# Patient Record
Sex: Male | Born: 1980
Health system: Southern US, Community
[De-identification: ages and names within clinical notes are randomized; demographics above are authoritative.]

---

## 2009-02-10 ENCOUNTER — Encounter: Admission: RE | Admit: 2009-02-10 | Discharge: 2009-02-10 | Payer: Self-pay | Admitting: Family Medicine

## 2010-10-22 IMAGING — CR DG CHEST 2V
2 series · 2 of 2 positions shown · non-contrast
Comparison: None

CLINICAL DATA: Family history lung cancer.  Routine physical exam.

CHEST - 2 VIEW

[view not recorded (1 of 2)]
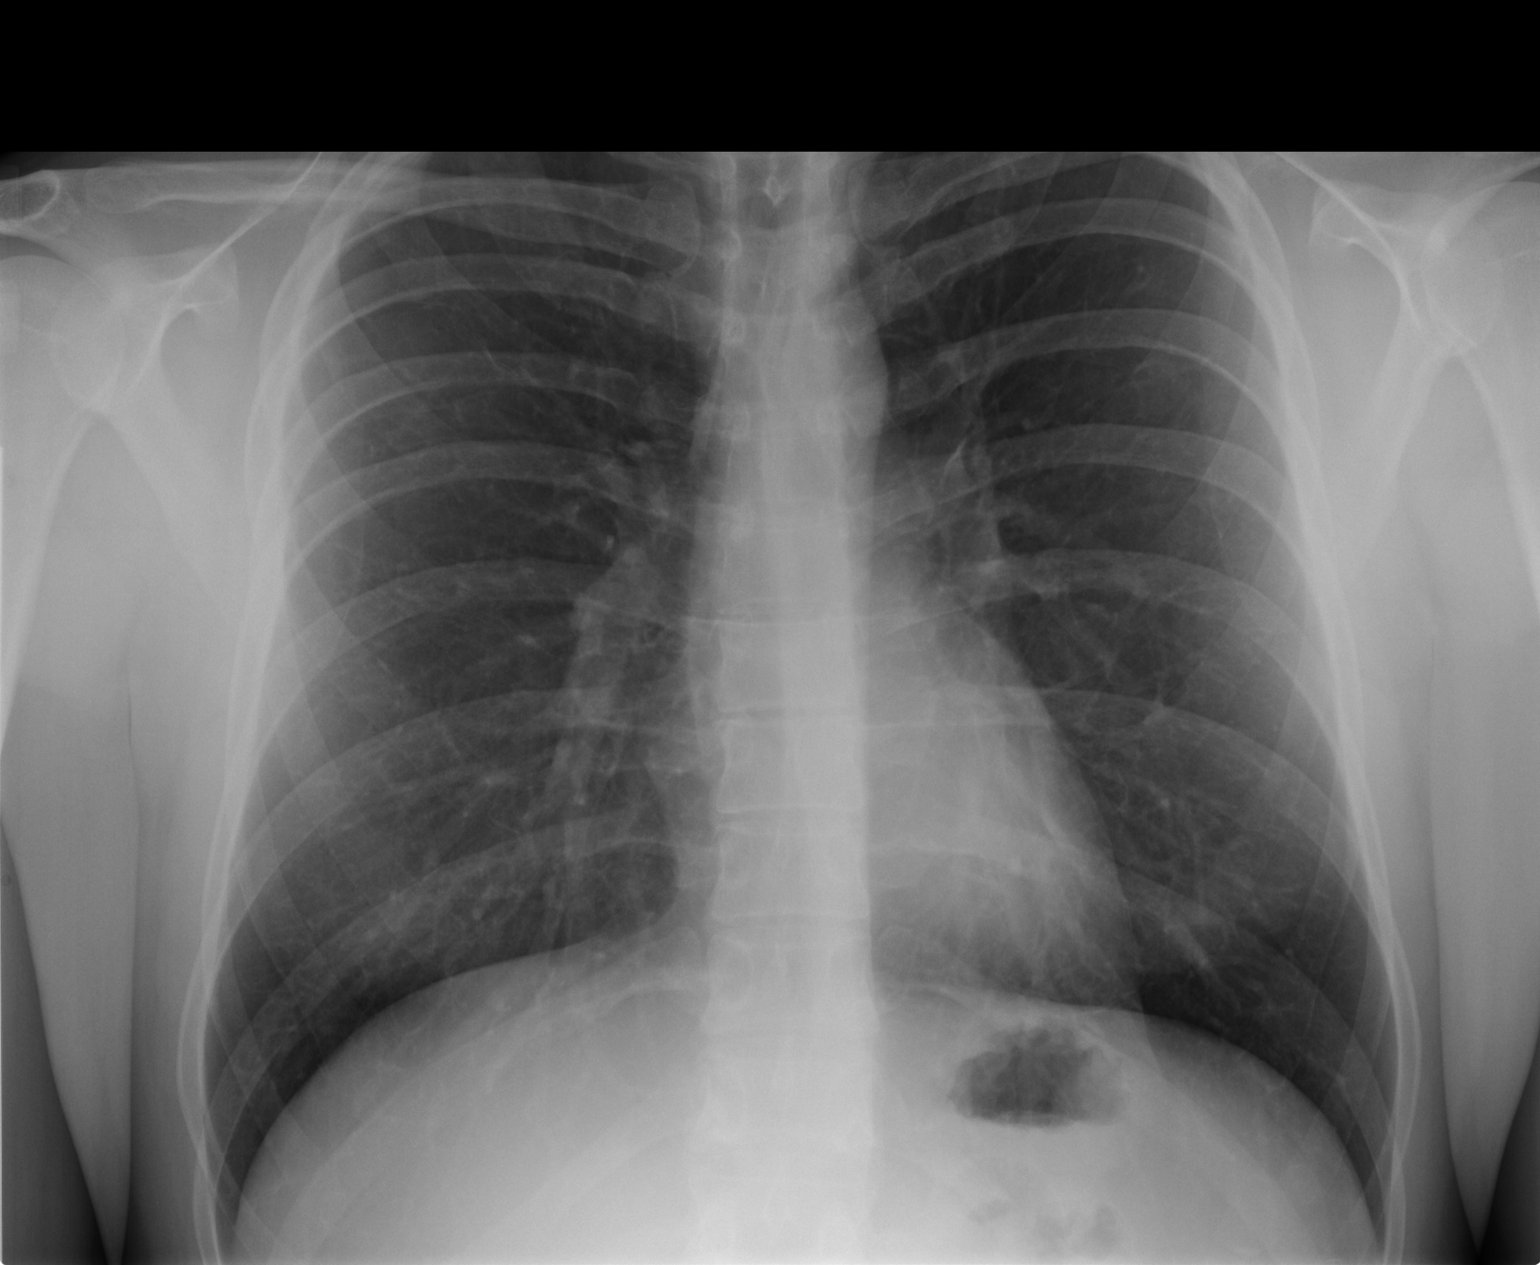

[view not recorded (2 of 2)]
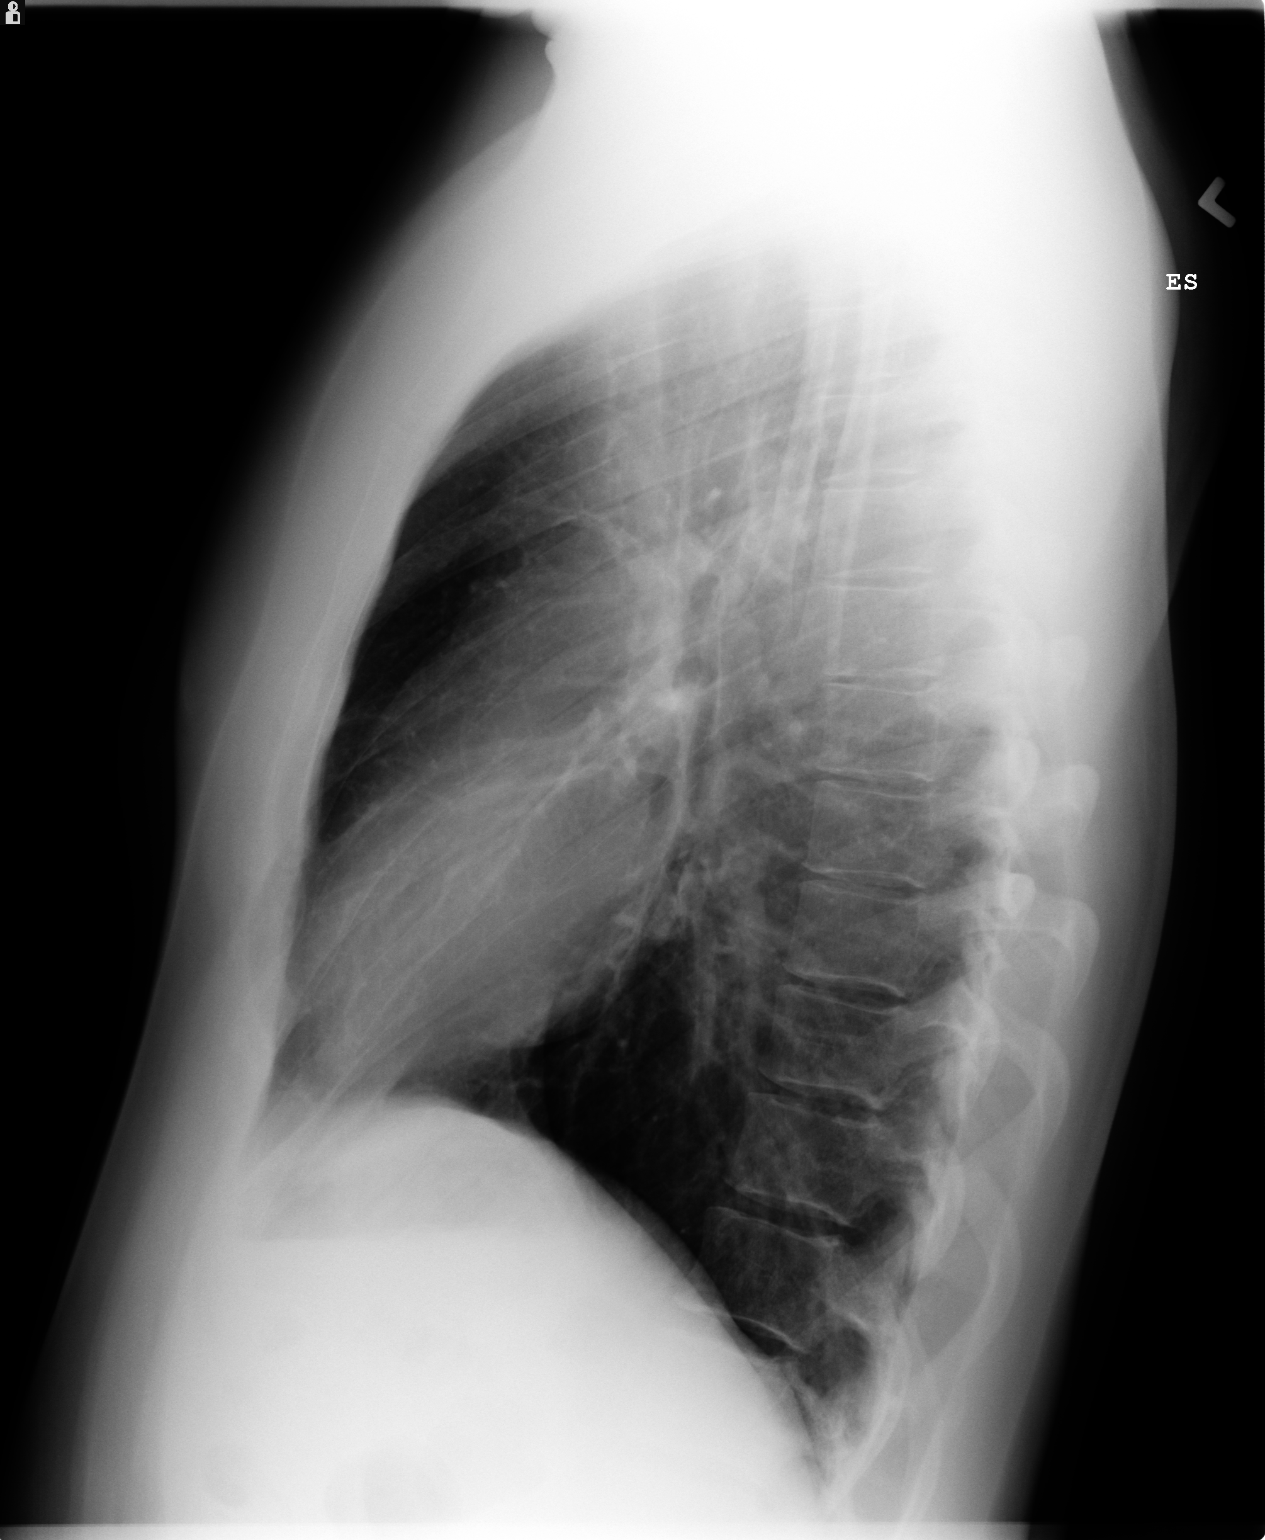

[2 of 2 positions shown; findings below may reference images not displayed]

FINDINGS: Lungs are clear.  Heart size and configuration are
normal.  Mediastinum, hila, pleura and osseous structures appear
normal.
IMPRESSION: Normal.

## 2016-07-03 DIAGNOSIS — R030 Elevated blood-pressure reading, without diagnosis of hypertension: Secondary | ICD-10-CM | POA: Diagnosis not present

## 2016-07-03 DIAGNOSIS — J0100 Acute maxillary sinusitis, unspecified: Secondary | ICD-10-CM | POA: Diagnosis not present

## 2016-07-20 DIAGNOSIS — R030 Elevated blood-pressure reading, without diagnosis of hypertension: Secondary | ICD-10-CM | POA: Diagnosis not present

## 2017-01-31 DIAGNOSIS — Z23 Encounter for immunization: Secondary | ICD-10-CM | POA: Diagnosis not present

## 2017-03-31 DIAGNOSIS — J069 Acute upper respiratory infection, unspecified: Secondary | ICD-10-CM | POA: Diagnosis not present

## 2017-03-31 DIAGNOSIS — B9789 Other viral agents as the cause of diseases classified elsewhere: Secondary | ICD-10-CM | POA: Diagnosis not present

## 2017-04-24 DIAGNOSIS — Z0289 Encounter for other administrative examinations: Secondary | ICD-10-CM | POA: Diagnosis not present

## 2017-04-24 DIAGNOSIS — J309 Allergic rhinitis, unspecified: Secondary | ICD-10-CM | POA: Diagnosis not present

## 2017-04-24 DIAGNOSIS — Z0283 Encounter for blood-alcohol and blood-drug test: Secondary | ICD-10-CM | POA: Diagnosis not present

## 2017-06-11 DIAGNOSIS — R0789 Other chest pain: Secondary | ICD-10-CM | POA: Diagnosis not present

## 2017-07-08 DIAGNOSIS — J0100 Acute maxillary sinusitis, unspecified: Secondary | ICD-10-CM | POA: Diagnosis not present

## 2018-01-23 DIAGNOSIS — S0033XA Contusion of nose, initial encounter: Secondary | ICD-10-CM | POA: Diagnosis not present

## 2018-01-23 DIAGNOSIS — S060X0A Concussion without loss of consciousness, initial encounter: Secondary | ICD-10-CM | POA: Diagnosis not present

## 2018-03-06 DIAGNOSIS — Z23 Encounter for immunization: Secondary | ICD-10-CM | POA: Diagnosis not present

## 2019-07-16 ENCOUNTER — Ambulatory Visit (INDEPENDENT_AMBULATORY_CARE_PROVIDER_SITE_OTHER): Payer: Commercial Managed Care - PPO | Admitting: Otolaryngology

## 2019-07-16 ENCOUNTER — Encounter (INDEPENDENT_AMBULATORY_CARE_PROVIDER_SITE_OTHER): Payer: Self-pay | Admitting: Otolaryngology

## 2019-07-16 ENCOUNTER — Other Ambulatory Visit: Payer: Self-pay

## 2019-07-16 VITALS — Temp 97.9°F

## 2019-07-16 DIAGNOSIS — M542 Cervicalgia: Secondary | ICD-10-CM

## 2019-07-16 DIAGNOSIS — G9001 Carotid sinus syncope: Secondary | ICD-10-CM | POA: Diagnosis not present

## 2019-07-16 NOTE — Progress Notes (Addendum)
HPI: Daniel Lozano is a 39 y.o. male who presents for evaluation of right neck discomfort he developed about 2 days ago.  He had this occur several months ago twice before and it spontaneously got better with no specific treatment.  He describes pain or discomfort in the right neck and is worse when he turns his neck to the left.  He also feels discomfort when he swallows with his head turned to the left.  He does not have any difficulty swallowing.  He has had no sore throat. He is not sure what brought this on. He does not smoke  No past medical history on file.  Social History   Socioeconomic History  . Marital status: Unknown    Spouse name: Not on file  . Number of children: Not on file  . Years of education: Not on file  . Highest education level: Not on file  Occupational History  . Not on file  Tobacco Use  . Smoking status: Never Smoker  . Smokeless tobacco: Never Used  Substance and Sexual Activity  . Alcohol use: Not on file  . Drug use: Not on file  . Sexual activity: Not on file  Other Topics Concern  . Not on file  Social History Narrative  . Not on file   Social Determinants of Health   Financial Resource Strain:   . Difficulty of Paying Living Expenses: Not on file  Food Insecurity:   . Worried About Programme researcher, broadcasting/film/video in the Last Year: Not on file  . Ran Out of Food in the Last Year: Not on file  Transportation Needs:   . Lack of Transportation (Medical): Not on file  . Lack of Transportation (Non-Medical): Not on file  Physical Activity:   . Days of Exercise per Week: Not on file  . Minutes of Exercise per Session: Not on file  Stress:   . Feeling of Stress : Not on file  Social Connections:   . Frequency of Communication with Friends and Family: Not on file  . Frequency of Social Gatherings with Friends and Family: Not on file  . Attends Religious Services: Not on file  . Active Member of Clubs or Organizations: Not on file  . Attends Tax inspector Meetings: Not on file  . Marital Status: Not on file   No family history on file. Not on File Prior to Admission medications   Not on File     Positive ROS: Otherwise negative  All other systems have been reviewed and were otherwise negative with the exception of those mentioned in the HPI and as above.  Physical Exam: Constitutional: Alert, well-appearing, no acute distress Ears: External ears without lesions or tenderness. Ear canals he has some wax in the right ear canal that was cleaned with a suction.  Left ear canal was clear.  Clear TMs bilaterally. Nasal: External nose without lesions. Septum deviated to the left with left septal spur posteriorly.. Clear nasal passages bilaterally. Oral: Lips and gums without lesions. Tongue and palate mucosa without lesions. Posterior oropharynx clear. Indirect laryngoscopy revealed a clear base of tongue vallecula epiglottis and vocal cords. Neck: On palpation of the neck there is no palpable adenopathy or masses noted.  The area that causes tenderness to him when he palpates it represents the carotid bulb.  This is slightly tender when I palpate this on the right side but no tenderness on the left side.  There is no palpable masses however. Respiratory: Breathing comfortably  Skin: No facial/neck lesions or rash noted.  Procedures  Assessment: Right neck pain may be related to carotidynia versus musculoskeletal.  Plan: Recommended use of ibuprofen such as Advil 600 mg 3 times daily for 1 week.  But reassured him of normal examination and this may spontaneously resolve on its own as it has in the past without treatment. He will follow up as needed  Radene Journey, MD

## 2021-07-26 ENCOUNTER — Emergency Department (HOSPITAL_COMMUNITY): Payer: BC Managed Care – PPO

## 2021-07-26 ENCOUNTER — Other Ambulatory Visit: Payer: Self-pay

## 2021-07-26 ENCOUNTER — Encounter (HOSPITAL_COMMUNITY): Payer: Self-pay | Admitting: Emergency Medicine

## 2021-07-26 ENCOUNTER — Emergency Department (HOSPITAL_COMMUNITY)
Admission: EM | Admit: 2021-07-26 | Discharge: 2021-07-26 | Disposition: A | Discharge status: Home / Self Care | Payer: BC Managed Care – PPO | Attending: Emergency Medicine | Admitting: Emergency Medicine

## 2021-07-26 DIAGNOSIS — G935 Compression of brain: Secondary | ICD-10-CM | POA: Insufficient documentation

## 2021-07-26 DIAGNOSIS — Z20822 Contact with and (suspected) exposure to covid-19: Secondary | ICD-10-CM | POA: Diagnosis not present

## 2021-07-26 DIAGNOSIS — G43409 Hemiplegic migraine, not intractable, without status migrainosus: Secondary | ICD-10-CM | POA: Insufficient documentation

## 2021-07-26 DIAGNOSIS — R791 Abnormal coagulation profile: Secondary | ICD-10-CM | POA: Insufficient documentation

## 2021-07-26 DIAGNOSIS — H538 Other visual disturbances: Secondary | ICD-10-CM | POA: Diagnosis present

## 2021-07-26 LAB — CBC
HCT: 44.5 % (ref 39.0–52.0)
Hemoglobin: 15.2 g/dL (ref 13.0–17.0)
MCH: 29.6 pg (ref 26.0–34.0)
MCHC: 34.2 g/dL (ref 30.0–36.0)
MCV: 86.7 fL (ref 80.0–100.0)
Platelets: 272 10*3/uL (ref 150–400)
RBC: 5.13 MIL/uL (ref 4.22–5.81)
RDW: 11.9 % (ref 11.5–15.5)
WBC: 6.1 10*3/uL (ref 4.0–10.5)
nRBC: 0 % (ref 0.0–0.2)

## 2021-07-26 LAB — I-STAT CHEM 8, ED
BUN: 31 mg/dL — ABNORMAL HIGH (ref 6–20)
Calcium, Ion: 1.1 mmol/L — ABNORMAL LOW (ref 1.15–1.40)
Chloride: 103 mmol/L (ref 98–111)
Creatinine, Ser: 0.9 mg/dL (ref 0.61–1.24)
Glucose, Bld: 91 mg/dL (ref 70–99)
HCT: 44 % (ref 39.0–52.0)
Hemoglobin: 15 g/dL (ref 13.0–17.0)
Potassium: 4.3 mmol/L (ref 3.5–5.1)
Sodium: 135 mmol/L (ref 135–145)
TCO2: 25 mmol/L (ref 22–32)

## 2021-07-26 LAB — DIFFERENTIAL
Abs Immature Granulocytes: 0.02 10*3/uL (ref 0.00–0.07)
Basophils Absolute: 0 10*3/uL (ref 0.0–0.1)
Basophils Relative: 0 %
Eosinophils Absolute: 0.1 10*3/uL (ref 0.0–0.5)
Eosinophils Relative: 2 %
Immature Granulocytes: 0 %
Lymphocytes Relative: 23 %
Lymphs Abs: 1.4 10*3/uL (ref 0.7–4.0)
Monocytes Absolute: 0.4 10*3/uL (ref 0.1–1.0)
Monocytes Relative: 6 %
Neutro Abs: 4.2 10*3/uL (ref 1.7–7.7)
Neutrophils Relative %: 69 %

## 2021-07-26 LAB — COMPREHENSIVE METABOLIC PANEL
ALT: 17 U/L (ref 0–44)
AST: 27 U/L (ref 15–41)
Albumin: 4.4 g/dL (ref 3.5–5.0)
Alkaline Phosphatase: 35 U/L — ABNORMAL LOW (ref 38–126)
Anion gap: 12 (ref 5–15)
BUN: 24 mg/dL — ABNORMAL HIGH (ref 6–20)
CO2: 21 mmol/L — ABNORMAL LOW (ref 22–32)
Calcium: 9.5 mg/dL (ref 8.9–10.3)
Chloride: 101 mmol/L (ref 98–111)
Creatinine, Ser: 1.08 mg/dL (ref 0.61–1.24)
GFR, Estimated: >60 mL/min (ref >60)
Glucose, Bld: 91 mg/dL (ref 70–99)
Potassium: 4.3 mmol/L (ref 3.5–5.1)
Sodium: 134 mmol/L — ABNORMAL LOW (ref 135–145)
Total Bilirubin: 1.4 mg/dL — ABNORMAL HIGH (ref 0.3–1.2)
Total Protein: 6.8 g/dL (ref 6.5–8.1)

## 2021-07-26 LAB — CBG MONITORING, ED: Glucose-Capillary: 93 mg/dL (ref 70–99)

## 2021-07-26 LAB — RESP PANEL BY RT-PCR (FLU A&B, COVID) ARPGX2
Influenza A by PCR: NEGATIVE
Influenza B by PCR: NEGATIVE
SARS Coronavirus 2 by RT PCR: NEGATIVE

## 2021-07-26 LAB — PROTIME-INR
INR: 1 (ref 0.8–1.2)
Prothrombin Time: 13 seconds (ref 11.4–15.2)

## 2021-07-26 LAB — APTT: aPTT: 27 seconds (ref 24–36)

## 2021-07-26 MED ORDER — LACTATED RINGERS IV BOLUS
1000.0000 mL | Freq: Once | INTRAVENOUS | Status: AC
  Administered 2021-07-26: 1000 mL via INTRAVENOUS

## 2021-07-26 MED ORDER — KETOROLAC TROMETHAMINE 15 MG/ML IJ SOLN
15.0000 mg | Freq: Once | INTRAMUSCULAR | Status: AC
  Administered 2021-07-26: 15 mg via INTRAVENOUS
  Filled 2021-07-26: qty 1

## 2021-07-26 MED ORDER — SODIUM CHLORIDE 0.9% FLUSH
3.0000 mL | Freq: Once | INTRAVENOUS | Status: AC
  Administered 2021-07-26: 3 mL via INTRAVENOUS

## 2021-07-26 MED ORDER — LORAZEPAM 1 MG PO TABS
1.0000 mg | ORAL_TABLET | Freq: Once | ORAL | Status: AC | PRN
  Administered 2021-07-26: 1 mg via ORAL
  Filled 2021-07-26: qty 1

## 2021-07-26 MED ORDER — DEXAMETHASONE SODIUM PHOSPHATE 10 MG/ML IJ SOLN
10.0000 mg | Freq: Once | INTRAMUSCULAR | Status: AC
  Administered 2021-07-26: 10 mg via INTRAVENOUS
  Filled 2021-07-26: qty 1

## 2021-07-26 NOTE — ED Provider Notes (Signed)
[facility] EMERGENCY DEPARTMENT Provider Note   CSN: [id number] Arrival date & time: 07/26/21  1227     History Chief Complaint  Patient presents with   Code Stroke   Headache    Daniel Lozano is a 41 y.o. male presents to the ED for evaluation of blurry vision, left hand and face numbness and tingling as well as a right-sided headache since this morning.  The patient reported that he was driving to work after dropping off his child and was having some blurry vision left greater than right.  He reported it subsided.  Between 10-10 30 patient reported he had some numbness in tingling to his left hand and left face but denies any weakness.  He reported he woke up with a right-sided headache around 3 out of 10 today.  He was seen in urgent care around 1120 and the symptoms have resolved.  He was sent to the emergency room via EMS and he reported around 1220 and then stent tingling in his finger, face, and lip have returned but have now subsided again.  He denies any chest pain, shortness breath, abdominal pain, nausea, vomiting, diarrhea, constipation, dysuria, hematuria, recent URI symptoms.  He denies any health history.  Does have a family history of migraines.  No known drug allergies.  Denies any daily medications.    Headache Associated symptoms: numbness       Home Medications Prior to Admission medications   Not on File      Allergies    Patient has no allergy information on record.    Review of Systems   Review of Systems  Eyes:  Positive for visual disturbance.  Neurological:  Positive for numbness and headaches. Negative for syncope.    SEE HPI Physical Exam Updated Vital Signs BP 137/82    Pulse 72    Temp 98.1 F (36.7 C) (Oral)    Resp 13    Ht _0  (1.803 m)    Wt 84.6 kg    SpO2 100%    BMI 26.01 kg/m  Physical Exam Vitals and nursing note reviewed.  Constitutional:      General: He is not in acute distress.    Appearance: Normal  appearance. He is not ill-appearing or toxic-appearing.  HENT:     Head: Normocephalic and atraumatic.     Mouth/Throat:     Mouth: Mucous membranes are moist.  Eyes:     General: No visual field deficit or scleral icterus.    Extraocular Movements: Extraocular movements intact.     Right eye: Normal extraocular motion and no nystagmus.     Left eye: Normal extraocular motion and no nystagmus.     Pupils: Pupils are equal, round, and reactive to light.  Cardiovascular:     Rate and Rhythm: Normal rate and regular rhythm.     Comments: Radial, DP, and PT pulses intact. Cap refill < 2seconds.  Pulmonary:     Effort: Pulmonary effort is normal.     Breath sounds: Normal breath sounds.  Abdominal:     General: Abdomen is flat. Bowel sounds are normal.     Palpations: Abdomen is soft.     Tenderness: There is no abdominal tenderness. There is no guarding.  Musculoskeletal:        General: No deformity.     Cervical back: Normal range of motion. No rigidity.  Skin:    General: Skin is warm and dry.  Neurological:     General:  No focal deficit present.     Mental Status: He is alert and oriented to person, place, and time. Mental status is at baseline.     GCS: GCS eye subscore is 4. GCS verbal subscore is 5. GCS motor subscore is 6.     Cranial Nerves: No cranial nerve deficit, dysarthria or facial asymmetry.     Sensory: No sensory deficit.     Motor: No weakness.     Coordination: Romberg sign negative. Coordination normal. Finger-Nose-Finger Test and Heel to [facility] Test normal.     Gait: Gait normal.     Comments: Patient answering questions appropriately with appropriate speech.  No facial droop noted.  Cranial nerves II through XII intact.  Strength 5 out of 5 in upper and lower extremities bilaterally.  Equal grip strength.  Sensation intact throughout the face, and extremities.  He is alert and oriented x3.  GCS of 15.  No facial asymmetry or dysarthria present.  Romberg negative.   He has normal finger-nose-finger test and heel-to-shin test is normal as well.  Gait is normal.  He has no visual deficits.      ED Results / Procedures / Treatments   Labs (all labs ordered are listed, but only abnormal results are displayed) Labs Reviewed  COMPREHENSIVE METABOLIC PANEL - Abnormal; Notable for the following components:      Result Value   Sodium 134 (*)    CO2 21 (*)    BUN 24 (*)    Alkaline Phosphatase 35 (*)    Total Bilirubin 1.4 (*)    All other components within normal limits  I-STAT CHEM 8, ED - Abnormal; Notable for the following components:   BUN 31 (*)    Calcium, Ion 1.10 (*)    All other components within normal limits  RESP PANEL BY RT-PCR (FLU A&B, COVID) ARPGX2  PROTIME-INR  APTT  CBC  DIFFERENTIAL  CBG MONITORING, ED    EKG EKG Interpretation  Date/Time:  Wednesday July 26 2021 12:58:22 EST Ventricular Rate:  65 PR Interval:  139 QRS Duration: 102 QT Interval:  420 QTC Calculation: 437 R Axis:   24 Text Interpretation: Sinus rhythm ST elev, probable normal early repol pattern No old tracing to compare Confirmed by Daleen Bo [phone]) on 07/26/2021 1:07:20 PM  Radiology MR BRAIN WO CONTRAST  Result Date: 07/26/2021 CLINICAL DATA:  Transient ischemic attack. Additional history provided: Left hand, arm and facial numbness, headaches since this morning. EXAM: MRI HEAD WITHOUT CONTRAST TECHNIQUE: Multiplanar, multiecho pulse sequences of the brain and surrounding structures were obtained without intravenous contrast. COMPARISON:  Head CT 07/26/2021. FINDINGS: Brain: Cerebral volume is normal. 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 The right cerebellar tonsil extends up to 5-6 mm below the level of the foramen magnum. On the right, this meets measurement criteria for a Chiari I malformation. However, the cerebellar tonsils maintain a normal rounded morphology, and there is only slight crowding at the level of the foramen magnum.  No cortical encephalomalacia is identified. No significant cerebral white matter disease. There is no acute infarct. No evidence of an intracranial mass. No chronic intracranial blood products. No extra-axial fluid collection. No midline shift. Vascular: Maintained flow voids within the proximal large arterial vessels. Skull and upper cervical spine: No focal suspicious marrow lesion. Sinuses/Orbits: Visualized orbits show no acute finding. Mild mucosal thickening within the right frontal, bilateral ethmoid, right sphenoid and bilateral maxillary sinuses. IMPRESSION: No evidence of acute intracranial abnormality. Cerebellar tonsillar ectopia (right  greater than left). The right cerebellar tonsil extends up to 5-6 mm below the level of the foramen magnum. On the right, this meets measurement criteria for a Chiari I malformation. However, the cerebellar tonsils maintain a normal rounded morphology, and there is only slight crowding at the level of the foramen magnum. Otherwise unremarkable non-contrast MRI appearance of the brain. Mild mucosal thickening within the paranasal sinuses. Electronically Signed   By: Kellie Simmering D.O.   On: 07/26/2021 15:35   CT HEAD CODE STROKE WO CONTRAST  Result Date: 07/26/2021 CLINICAL DATA:  Code stroke.  41 year old male. EXAM: CT HEAD WITHOUT CONTRAST TECHNIQUE: Contiguous axial images were obtained from the base of the skull through the vertex without intravenous contrast. RADIATION DOSE REDUCTION: This exam was performed according to the departmental dose-optimization program which includes automated exposure control, adjustment of the mA and/or kV according to patient size and/or use of iterative reconstruction technique. COMPARISON:  None. FINDINGS: Brain: Normal cerebral volume. No midline shift, ventriculomegaly, mass effect, evidence of mass lesion, intracranial hemorrhage or evidence of cortically based acute infarction. Gray-white matter differentiation is within normal  limits throughout the brain. Vascular: No suspicious intracranial vascular hyperdensity. Skull: Negative. Sinuses/Orbits: Mild mucosal thickening at the frontoethmoidal recesses. Other Visualized paranasal sinuses and mastoids are clear. Other: Visualized orbits and scalp soft tissues are within normal limits. ASPECTS [facility] Stroke Program Early CT Score) Total score (0-10 with 10 being normal): 10 IMPRESSION: 1. Normal noncontrast CT appearance of the brain.  ASPECTS 10. 2. These results were communicated to Dr. Lorrin Goodell at 12:45 pm on 07/26/2021 by text page via the [facility] messaging system. Electronically Signed   By: Genevie Ann M.D.   On: 07/26/2021 12:45     Procedures Procedures   Medications Ordered in ED Medications  sodium chloride flush (NS) 0.9 % injection 3 mL (3 mLs Intravenous Given 07/26/21 1256)  LORazepam (ATIVAN) tablet 1 mg (1 mg Oral Given 07/26/21 1403)  ketorolac (TORADOL) 15 MG/ML injection 15 mg (15 mg Intravenous Given 07/26/21 1405)  dexamethasone (DECADRON) injection 10 mg (10 mg Intravenous Given 07/26/21 1404)  lactated ringers bolus 1,000 mL (0 mLs Intravenous Stopped 07/26/21 1715)    ED Course/ Medical Decision Making/ A&P                           Medical Decision Making Amount and/or Complexity of Data Reviewed Labs: ordered. Radiology: ordered.  Risk Prescription drug management.   41 year old male presents emerged department for evaluation of right-sided headache with left-sided numbness and tingling as well as blurred vision since earlier today.  Differential diagnosis includes but is not limited to TIA, stroke, complex migraine, electrolyte abnormality.  Vital signs are normal.  Patient normotensive, afebrile, normal pulse rate, satting well on room air with no increased work of breathing.  Physical exam shows a benign neuro exam. Patient answering questions appropriately with appropriate speech.  No facial droop noted.  Cranial nerves II through XII intact.   Strength 5 out of 5 in upper and lower extremities bilaterally.  Equal grip strength.  Sensation intact throughout the face, and extremities.  He is alert and oriented x3.  GCS of 15.  No facial asymmetry or dysarthria present.  Romberg negative.  He has normal finger-nose-finger test and heel-to-shin test is normal as well.  Gait is normal.  He has no visual deficits.    The patient was initially evaluated by my attending physician who called code  stroke was evaluated by neurology, Dr. Annice Pih, who assessed this patient at bedside in thinks this is a complex migraine given the right-sided headache with left-sided symptoms.  Suggested continuation with a CT and MRI to rule out any TIA.  I independently reviewed and interpreted the patient's labs and imaging agree with radiologist findings.  CMP shows mildly decreased sodium 134.  Mildly decreased CO2 at 21.  Mildly increased BUN at 24.  Mildly decreased alk phos at 35.  Mildly increased total bili 1.4.  Normal creatinine.  No other electrolyte abnormalities.  CBC shows no signs leukocytosis or anemia.  Normal PT/INR.  Normal APTT.  Negative for COVID and flu.  CBG 93. CT of head shows normal noncontrast CT appearance of the brain with aspects of 10.  MRI shows no evidence of any acute intracranial abnormality.  There is cerebral tonsillar ectopia with right greater than left with the right cerebral tonsil extends up to 5 to 6 mm below the level of the foreman magnum which meets criteria for Chiari I malformation.  Given the new MRI readings, spoke with Dr. Lorrin Goodell again who discussed no change in further work-up, his symptoms are not likely from his Chiari malformation, and reported he can follow-up with neurosurgery outpatient as well as neurology.  Was given Ativan for MRI.  Migraine cocktail also given as well.  The patient was evaluated several times by me with normal neuro exams and without any recurrence of any symptoms.  He reports his  headache is almost gone and is around 1/10.  Repeat neuro exams were normal.  At this time, the patient is safe for discharge.  Referring for outpatient neurology and neurosurgery based on the CT complex migraine history as well as Chiari malformation.  Education provided on complex migraines and Chiari malformation.  Discussed outpatient follow-up.  Strict return precautions were discussed with both patient and wife at bedside.  They agree to plan.  Patient is stable being discharged home in good condition.  I discussed this case with my attending physician who cosigned this note including patient's presenting symptoms, physical exam, and planned diagnostics and interventions. Attending physician stated agreement with plan or made changes to plan which were implemented.   Attending physician assessed patient at bedside.  Final Clinical Impression(s) / ED Diagnoses Final diagnoses:  Hemiplegic migraine without status migrainosus, not intractable  Chiari malformation type I [facility])    Rx / DC Orders ED Discharge Orders     None         Sherrell Puller, PA-C 07/31/21 2254    Daleen Bo, MD 08/01/21 [phone]

## 2021-07-26 NOTE — ED Provider Notes (Signed)
?  Face-to-face evaluation ? ? ?History: Patient presenting for unilateral blurred vision with headache, from home, by EMS.  No prior in the past.  Seen by me for clearance at the bridge, after presenting to the ED for code stroke. ? ?Physical exam: Alert, calm, cooperative.  No facial asymmetry.  No dysarthria or ataxia. ? ?MDM: Evaluation for  ?Chief Complaint  ?Patient presents with  ? Code Stroke  ?  ? ?Patient presenting for headache with blurred vision left eye.  No prior similar problems and no chronic medical conditions.  History of prior headaches.  Patient has improvement spontaneously.  Advanced imaging ordered to evaluate for occult CVA. ? ?Medical screening examination/treatment/procedure(s) were conducted as a shared visit with non-physician practitioner(s) and myself.  I personally evaluated the patient during the encounter ? ?  ?Mancel Bale, MD ?08/01/21 [phone] ? ?

## 2021-07-26 NOTE — ED Notes (Signed)
Pt remains in MRI at this time  

## 2021-07-26 NOTE — ED Triage Notes (Signed)
Pt arrives by [facility] for Code Stroke. 4098 pt endorses blurred vision, upper L sided. 1010 this AM pt noticed L arm, hand, and L sided facial numbness lasting 5 mins. 1123 pt arrived at [facility] and sx had subsided except for HA which lasted all morning. 1221 pt endorses return of L hand numbness and tingling. 1233 pt endorses tongue numbness.  ?

## 2021-07-26 NOTE — Discharge Instructions (Addendum)
You were seen here today for evaluation of your headache, visual changes, and numbness/tingling. Your MRI and CT scan did not show any signs of a stroke or TIA. Your MRI showed a Chiari malformation. This was most likely an incidental finding, but I have added follow up with Neurosurgery for this. I have also added in the information in for a neurology follow up for these complex migraines. I recommend a supplement such as MigraGard to help with these symptoms. ? ?Contact a doctor if: ?You get a migraine headache that is different or worse than others you have had. ?You have more than 15 headache days in one month. ?Get help right away if: ?Your migraine headache gets very bad. ?Your migraine headache lasts longer than 72 hours. ?You have a fever. ?You have a stiff neck. ?You have trouble seeing. ?Your muscles feel weak or like you cannot control them. ?You start to lose your balance a lot. ?You start to have trouble walking. ?You pass out (faint). ?You have a seizure. ?You develop weakness or numbness in one or more of your limbs. ?You develop dizziness, slurred speech, double vision, weakness, or numbness with a severe headache. ?

## 2021-07-26 NOTE — Progress Notes (Signed)
Responded to page to support patient. Upon my arrival pt. had gone to CT. Then returned but not available staff caring for pt.  Nurse will page if needed further. ? ?Cristopher Peru, [facility], Pager [phone]  ? ?

## 2021-07-26 NOTE — ED Notes (Signed)
Patient transported to MRI 

## 2021-07-26 NOTE — Consult Note (Signed)
Neurology Consultation ? ?Reason for Consult: Code Stroke ?Referring Physician: Dr. Eulis Foster ? ?CC: Left hand and face tingling with blurry vision  ? ?History is obtained from: Patient, chart review  ? ?HPI: Daniel Lozano is a 41 y.o. male with no known medical history other than occasional right-sided headaches who presented to the ED for evaluation of acute onset of left visual field blurry vision, left hand/fingertip paraesthesias, and left face and oral numbness. He states that his symptoms started at approximately 08:10 this morning while driving to work in association with a right-sided headache. He decided to go to urgent care for evaluation but his symptoms resolved around 11:20. Urgent care recommended the patient be evaluated in the ED and en route to the hospital via EMS, patient states that his fingertips again began tingling in addition to his face and lips. On arrival to the ED, a Code Stroke was activated for further neurology evaluation.  ? ?LKW: At baseline without deficits on evaluation at 12:40 ?TNK given?: no, symptoms resolved on neurology evaluation  ?IR Thrombectomy? No, presentation is not consistent with an LVO ?Modified Rankin Scale: 0-Completely asymptomatic and back to baseline post- stroke ? ?ROS: A complete ROS was performed and is negative except as noted in the HPI.  ? ?History reviewed. No pertinent past medical history. ? ?No family history on file. ?Patient states that his sister gets frequent migraines.  ? ?Social History:  ? reports that he has never smoked. He has never used smokeless tobacco. He reports current alcohol use of about 10.0 standard drinks per week. He reports that he does not use drugs. ? ?Medications ? ?Current Facility-Administered Medications:  ?  LORazepam (ATIVAN) tablet 1 mg, 1 mg, Oral, Once PRN, Sherrell Puller, PA-C ?No current outpatient medications on file. ? ?Exam: ?Current vital signs: ?BP 137/82   Pulse 72   Temp 98.1 ?F (36.7 ?C) (Oral)   Resp 13    Ht 5\' 11"  (1.803 m)   Wt 84.6 kg   SpO2 100%   BMI 26.01 kg/m?  ?Vital signs in last 24 hours: ?Temp:  [98.1 ?F (36.7 ?C)] 98.1 ?F (36.7 ?C) (03/01 1254) ?Pulse Rate:  [72] 72 (03/01 1254) ?Resp:  [13] 13 (03/01 1254) ?BP: (137)/(82) 137/82 (03/01 1254) ?SpO2:  [100 %] 100 % (03/01 1254) ?Weight:  [84.6 kg] 84.6 kg (03/01 1200) ? ?GENERAL: Awake, alert, in no acute distress ?Psych: Affect appropriate for situation, patient is calm and cooperative with examination ?Head: Normocephalic and atraumatic, without obvious abnormality ?EENT: Normal conjunctivae, dry mucous membranes, no OP obstruction ?LUNGS: Normal respiratory effort. Non-labored breathing on room air ?CV: Regular rate and rhythm on telemetry ?ABDOMEN: Soft, non-tender, non-distended ?Extremities: Warm, well perfused, without obvious deformity ? ?NEURO:  ?Mental Status: Awake, alert, and oriented to person, place, time, and situation. ?He is able to provide a clear and coherent history of present illness. ?Speech/Language: speech is intact without dysarthria.    ?Naming, repetition, fluency, and comprehension intact without aphasia. ?No neglect is noted ?Cranial Nerves:  ?II: PERRL. Visual fields full.  ?III, IV, VI: EOMI without ptosis, nystagmus, or gaze preference ?V: Sensation is intact to light touch and symmetrical to face. ?VII: Face is symmetric resting and smiling.  ?VIII: Hearing is intact to voice ?IX, X: Palate elevation is symmetric. Phonation normal.  ?XI: Normal sternocleidomastoid and trapezius muscle strength ?XII: Tongue protrudes midline without fasciculations.   ?Motor: 5/5 strength is all muscle groups without vertical drift.   ?Tone is normal. Bulk is  normal.  ?Sensation: Intact to light touch bilaterally in all four extremities. No extinction to DSS present.  ?Coordination: FTN intact bilaterally. HKS intact bilaterally.  ?Gait: Deferred ? ?NIHSS: 0 ? ?Labs ?I have reviewed labs in epic and the results pertinent to this  consultation are: ?CBC ?   ?Component Value Date/Time  ? WBC 6.1 07/26/2021 1235  ? RBC 5.13 07/26/2021 1235  ? HGB 15.0 07/26/2021 1239  ? HCT 44.0 07/26/2021 1239  ? PLT 272 07/26/2021 1235  ? MCV 86.7 07/26/2021 1235  ? MCH 29.6 07/26/2021 1235  ? MCHC 34.2 07/26/2021 1235  ? RDW 11.9 07/26/2021 1235  ? LYMPHSABS 1.4 07/26/2021 1235  ? MONOABS 0.4 07/26/2021 1235  ? EOSABS 0.1 07/26/2021 1235  ? BASOSABS 0.0 07/26/2021 1235  ? ?CMP  ?   ?Component Value Date/Time  ? NA 135 07/26/2021 1239  ? K 4.3 07/26/2021 1239  ? CL 103 07/26/2021 1239  ? GLUCOSE 91 07/26/2021 1239  ? BUN 31 (H) 07/26/2021 1239  ? CREATININE 0.90 07/26/2021 1239  ? ?Lipid Panel  ?No results found for: CHOL, TRIG, HDL, CHOLHDL, VLDL, LDLCALC, LDLDIRECT ? ?Imaging ?I have reviewed the images obtained: ? ?CT-scan of the brain 3/1: ?1. Normal noncontrast CT appearance of the brain.  ASPECTS 10. ?2. These results were communicated to Dr. Lorrin Goodell at 12:45 pm on 07/26/2021 by text page via the [facility] messaging system. ? ?Assessment: 41 y.o. male who presented to the ED 3/1 for evaluation of left hand/fingertip paresthesias, left face numbness, and left visual field blurry vision. Initial onset was at 08:10 with resolution of symptoms at 11:20. Patient's symptoms recurred en route to the ED, again with resolution during [facility] imaging. ?- On neurology evaluation, patient's symptoms had fully resolved without further deficits.  ?- Presentation is felt to be most consistent with a complex migraine with symptom onset in association with right-sided headache and less likely stroke with no known stroke risk factors.  ? ?Recommendations: ?- Headache management per EDP ?- Frequent neuro checks, with recurrence of symptoms, please notify neurology ?- MRI brain wo contrast ?- If MRI brain is without acute findings, no further inpatient neurology recommendations at this time.  ? ?Anibal Henderson, AGAC-NP ?[facility] ?Pager: [phone] ? ?NEUROHOSPITALIST ADDENDUM ?Performed a face to face diagnostic evaluation.  ? ?I have reviewed the contents of history and physical exam as documented by PA/ARNP/Resident and agree with above documentation.  ?I have discussed and formulated the above plan as documented. Edits to the note have been made as needed. ? ?Impression/Key exam findings/Plan: 41 y/o healthy male with acute neurological change with L sided tingling and blurred vision in the setting of a R sided headache. Has personal and family hx of mgiraine but never had focal neuro symptoms in the past with his migraines. No obvious stroke risk factors: he does not smoke, no DM2, no HTN, no HLD, no hx of afibb, not sure if he has family hx of strokes. ? ?Suspect that this is probably just hemiplegic migraine, TIA/minor ischemic stroke is low on differential. ? ?Will get MRI Brain without contrast for further evaluation, headache cocktail per ED. ? ?Plan discussed with Dr. Eulis Foster with the ED team. ? ?Donnetta Simpers, MD ?[facility] ?[id number] ?  ?If 7pm to 7am, please call on call as listed on AMION. ? ?

## 2021-07-26 NOTE — ED Notes (Signed)
[facility] called to activate code stroke per RN Karens request. Spoke to Mayagi¼ez at [place].  ?

## 2021-07-26 NOTE — ED Notes (Signed)
Pt verbalizes understanding of discharge instructions. Opportunity for questions and answers were provided. Pt discharged from the ED.   ?

## 2021-07-26 NOTE — Code Documentation (Signed)
Pt is a 41 yr old male with no past medical history. Per his report, he woke up normally, and began having blurred vision in his left visual field at 0810. Later on, he felt tingling in left hand. He drove to urgent care, where sx resolved completely. Symptoms returned at 1221, and pt was transferred to [facility], and stroke was activated at bridge. At time of arrival, pt was having some numbness in the left side of his face. He was cleared by EDP, labs and CBG drawn at bridge. Pt taken to CT at 1233. CTNC neg for acute hemorrhage per Dr Derry Lory. Pt examined fully in CT: All symptoms had resolved. NIHSS 0. Pt returned to room 2 where his workup will continue. He will need q 2 hr VS and mNIHSS. Should symptoms re-occur, re-activate code stroke. 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 Pt not eligible for thrombolytic as resolution of symptoms. Pt ineligible for NIR as LVO negative. ?

## 2021-08-17 ENCOUNTER — Ambulatory Visit: Payer: Self-pay | Admitting: Psychiatry

## 2023-04-06 IMAGING — MR MR HEAD W/O CM
8 of 10 series · 36 of 48 positions shown · non-contrast
Comparison: Head CT 07/26/2021.

CLINICAL DATA: Transient ischemic attack. Additional history
provided: Left hand, arm and facial numbness, headaches since this
morning.

EXAM:
MRI HEAD WITHOUT CONTRAST
TECHNIQUE: Multiplanar, multiecho pulse sequences of the brain and surrounding
structures were obtained without intravenous contrast.

[Series 3: DWI · axial · 3.0mm · 1.09mm/px · z∈[-72,+83]mm · 9 of 110 slices shown (1 of 4)]
[im 1/110]
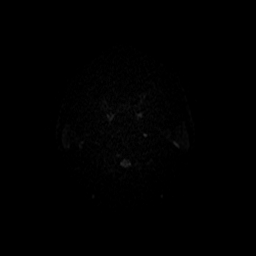
[im 14/110]
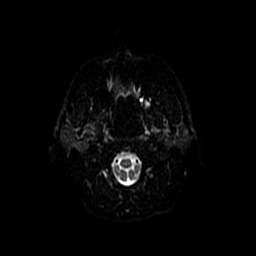
[im 28/110]
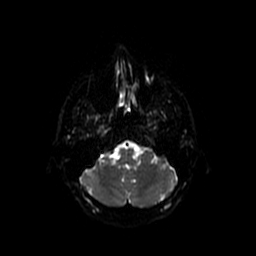
[im 41/110]
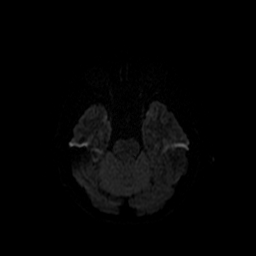
[im 55/110]
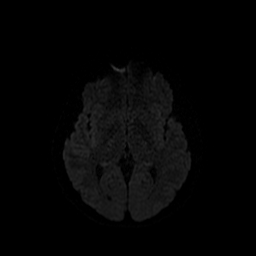
[im 69/110]
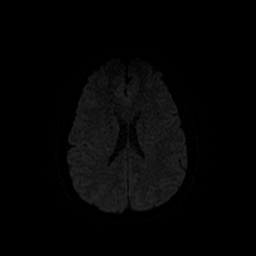
[im 82/110]
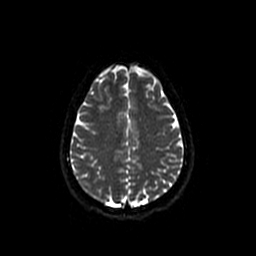
[im 96/110]
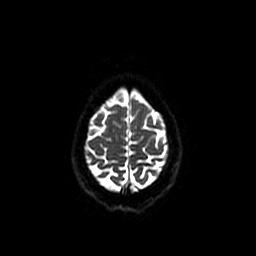
[im 110/110]
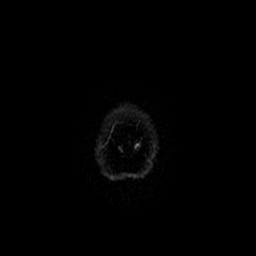

[Series 4: DWI · coronal · 5.0mm · 1.09mm/px · 7 of 78 slices shown (2 of 4)]
[im 1/78]
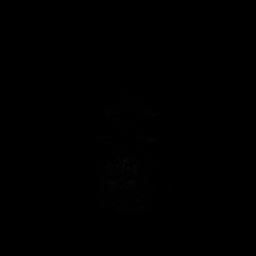
[im 13/78]
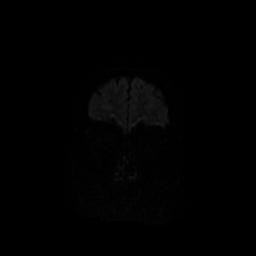
[im 26/78]
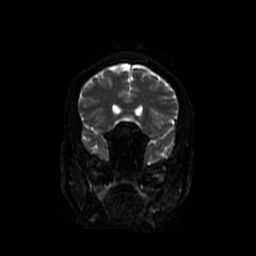
[im 39/78]
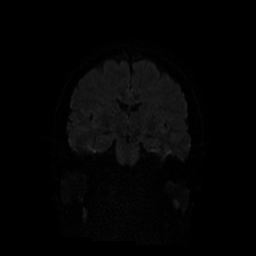
[im 52/78]
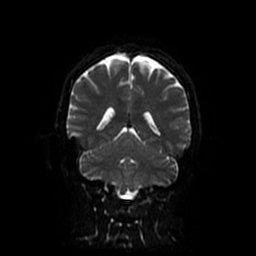
[im 65/78]
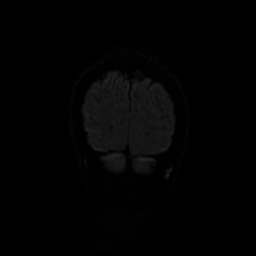
[im 78/78]
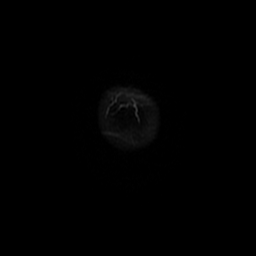

[Series 5: T1 · sagittal · 5.0mm · 0.47mm/px · 2 of 26 slices shown]
[im 1/26]
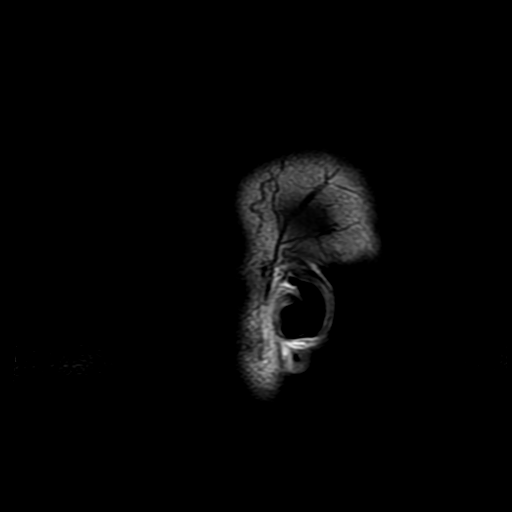
[im 26/26]
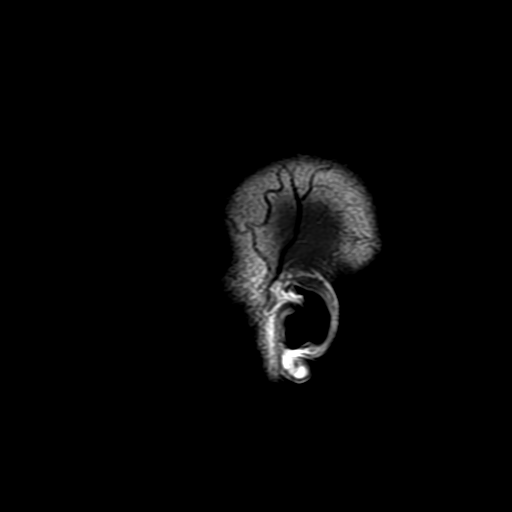

[Series 6: T2 · axial · 5.0mm · 0.47mm/px · z∈[-64,+91]mm · 3 of 28 slices shown (1 of 2)]
[im 1/28]
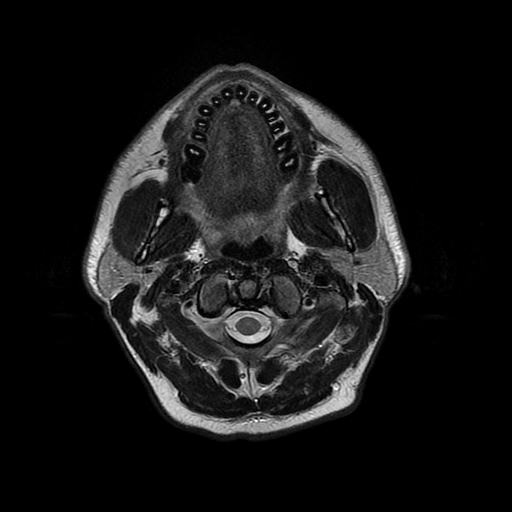
[im 14/28]
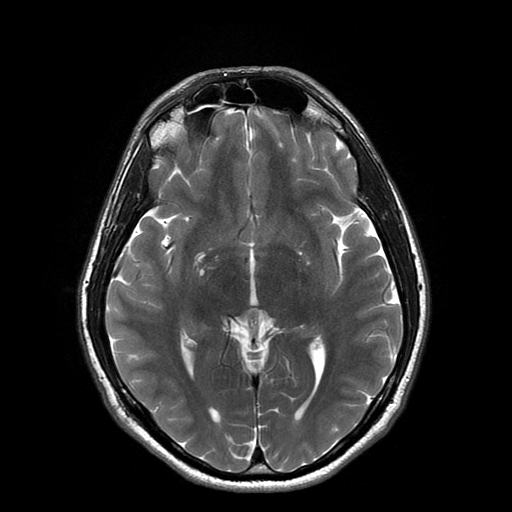
[im 28/28]
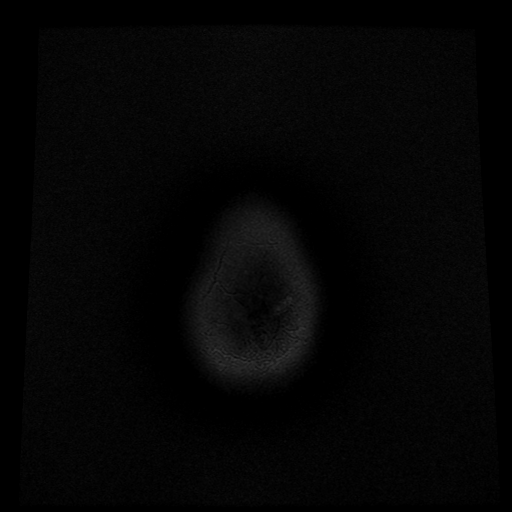

[Series 7: FLAIR · axial · 3.0mm · 0.47mm/px · z∈[-64,+91]mm · 3 of 28 slices shown]
[im 1/28]
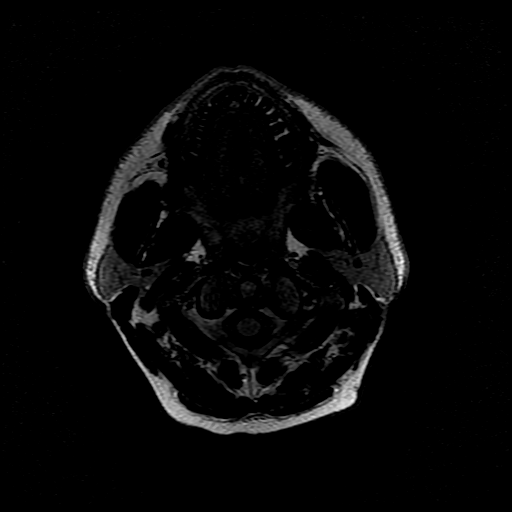
[im 14/28]
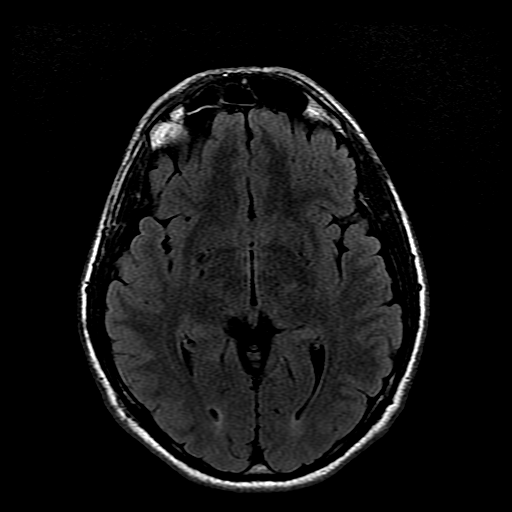
[im 28/28]
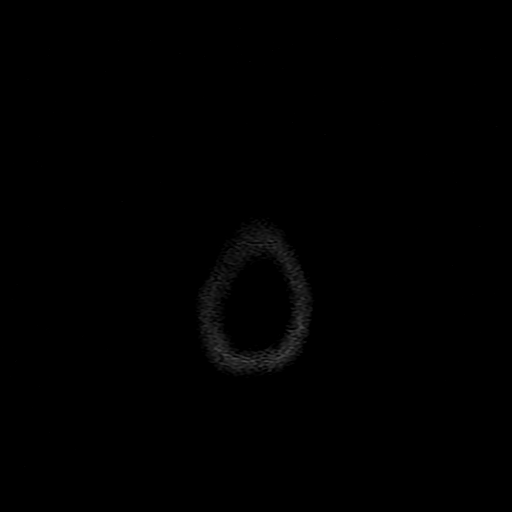

[Series 10: T2 · coronal · 5.0mm · 0.39mm/px · 3 of 30 slices shown (2 of 2)]
[im 1/30]
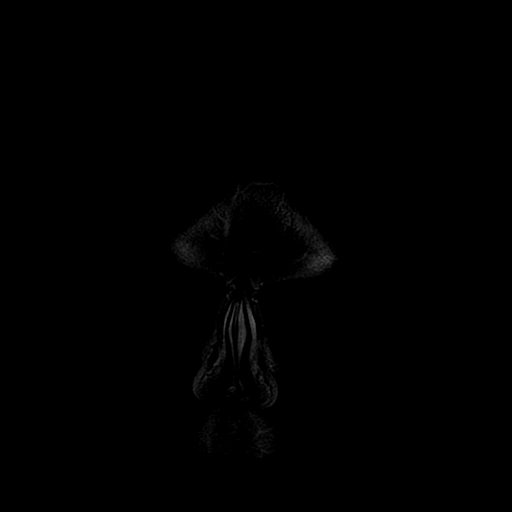
[im 15/30]
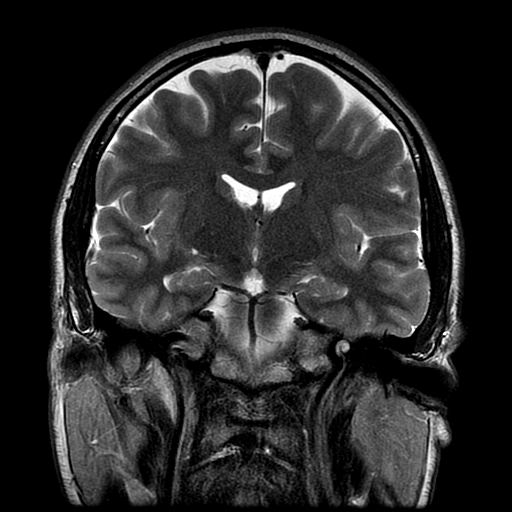
[im 30/30]
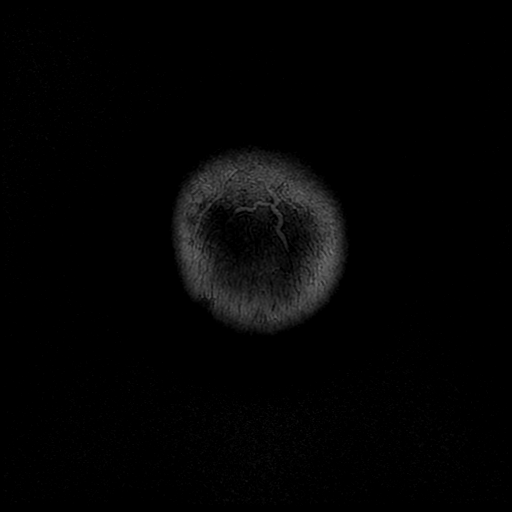

[Series 300: DWI · axial · 3.0mm · 1.09mm/px · z∈[-72,+83]mm · 5 of 55 slices shown (3 of 4)]
[im 1/55]
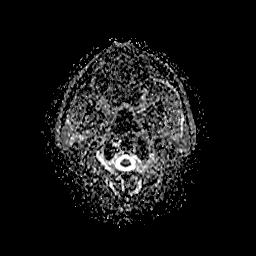
[im 14/55]
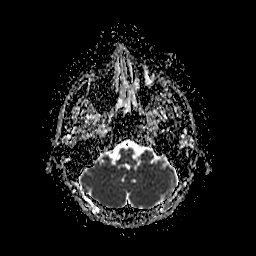
[im 28/55]
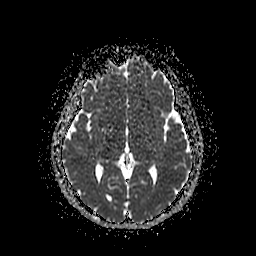
[im 41/55]
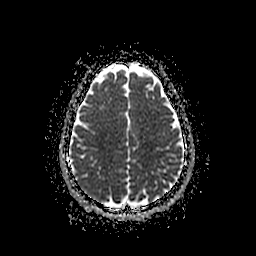
[im 55/55]
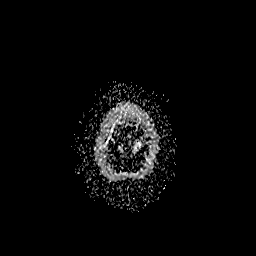

[Series 400: DWI · coronal · 5.0mm · 1.09mm/px · 4 of 39 slices shown (4 of 4)]
[im 1/39]
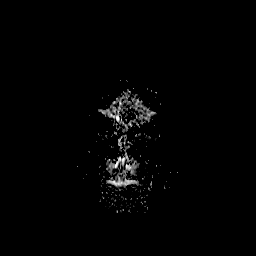
[im 13/39]
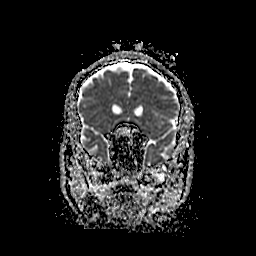
[im 26/39]
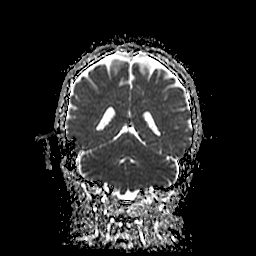
[im 39/39]
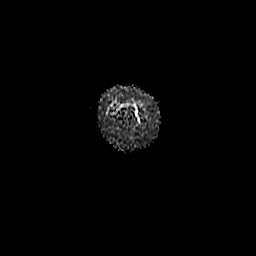

[36 of 48 positions shown; findings below may reference images not displayed]

FINDINGS: Brain:

Cerebral volume is normal.

Cerebellar tonsillar ectopia (right greater than left). The right
cerebellar tonsil extends up to 5-6 mm below the level of the
foramen magnum. On the right, this meets measurement criteria for a
Chiari I malformation. However, the cerebellar tonsils maintain a
normal rounded morphology, and there is only slight crowding at the
level of the foramen magnum.

No cortical encephalomalacia is identified. No significant cerebral
white matter disease.

There is no acute infarct.

No evidence of an intracranial mass.

No chronic intracranial blood products.

No extra-axial fluid collection.

No midline shift.

Vascular: Maintained flow voids within the proximal large arterial
vessels.

Skull and upper cervical spine: No focal suspicious marrow lesion.

Sinuses/Orbits: Visualized orbits show no acute finding. Mild
mucosal thickening within the right frontal, bilateral ethmoid,
right sphenoid and bilateral maxillary sinuses.
IMPRESSION: No evidence of acute intracranial abnormality.

Cerebellar tonsillar ectopia (right greater than left). The right
cerebellar tonsil extends up to 5-6 mm below the level of the
foramen magnum. On the right, this meets measurement criteria for a
Chiari I malformation. However, the cerebellar tonsils maintain a
normal rounded morphology, and there is only slight crowding at the
level of the foramen magnum.

Otherwise unremarkable non-contrast MRI appearance of the brain.

Mild mucosal thickening within the paranasal sinuses.
